# Patient Record
Sex: Female | Born: 2000 | Race: Black or African American | Hispanic: No | Marital: Single | Smoking: Never smoker
Health system: Southern US, Community
[De-identification: ages and names within clinical notes are randomized; demographics above are authoritative.]

## PROBLEM LIST (undated history)

## (undated) ENCOUNTER — Inpatient Hospital Stay (HOSPITAL_COMMUNITY): Payer: Self-pay

---
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
## 2018-08-20 ENCOUNTER — Ambulatory Visit (HOSPITAL_COMMUNITY)
Admission: EM | Admit: 2018-08-20 | Discharge: 2018-08-20 | Disposition: A | Discharge status: Home / Self Care | Payer: BC Managed Care – PPO | Attending: Internal Medicine | Admitting: Internal Medicine

## 2018-08-20 ENCOUNTER — Encounter (HOSPITAL_COMMUNITY): Payer: Self-pay | Admitting: Emergency Medicine

## 2018-08-20 DIAGNOSIS — M5432 Sciatica, left side: Secondary | ICD-10-CM

## 2018-08-20 MED ORDER — METHYLPREDNISOLONE 4 MG PO TBPK: ORAL_TABLET | ORAL | 0 refills | Status: DC

## 2018-08-20 MED ORDER — MELOXICAM 7.5 MG PO TABS: 7.5000 mg | ORAL_TABLET | Freq: Two times a day (BID) | ORAL | 0 refills | Status: DC | PRN

## 2018-08-20 MED ORDER — CYCLOBENZAPRINE HCL 10 MG PO TABS: 10.0000 mg | ORAL_TABLET | Freq: Two times a day (BID) | ORAL | 0 refills | Status: AC | PRN

## 2018-08-20 MED ORDER — TRIAMCINOLONE ACETONIDE 40 MG/ML IJ SUSP
INTRAMUSCULAR | Status: AC
  Filled 2018-08-20: qty 1

## 2018-08-20 NOTE — ED Triage Notes (Signed)
Pt presents to [facility] for yearly recurrence of lower left sided back pain, radiation to hip and back of left leg.  Pt denies loss of control of bowels or bladder.

## 2019-04-28 NOTE — ED Provider Notes (Signed)
[facility]    CSN: [id number] Arrival date & time: 08/20/18  1050      History   Chief Complaint Chief Complaint  Patient presents with  . Back Pain    HPI Maria Sparks is a 18 y.o. female.   Pt with known sciatica c/o back pain that radiates down her left leg.  This is a recurrent problem.  Does not recall injuring her back. Denies weakness in LE.      History reviewed. No pertinent past medical history.  Patient Active Problem List   Diagnosis Date Noted  . Peritonsillar abscess 03/04/2017  . Sore throat 03/04/2017  . Screening-pulmonary TB 02/27/2017  . Acute pharyngitis 02/27/2017  . School physical exam 02/27/2017  . Acne 04/15/2012  . ACUTE SINUSITIS, UNSPECIFIED 05/02/2008  . ACUTE BRONCHITIS 06/29/2007    History reviewed. No pertinent surgical history.  OB History   No obstetric history on file.      Home Medications    Prior to Admission medications   Medication Sig Start Date End Date Taking? Authorizing Provider  cephALEXin (KEFLEX) 500 MG capsule Take 1 capsule (500 mg total) 4 (four) times daily by mouth. 05/31/17   Renne Crigler, PA-C  cyclobenzaprine (FLEXERIL) 10 MG tablet Take 1 tablet (10 mg total) by mouth 2 (two) times daily as needed for muscle spasms. 08/20/18   Arnaldo Natal, MD  ibuprofen (ADVIL,MOTRIN) 600 MG tablet Take 1 tablet (600 mg total) by mouth every 6 (six) hours as needed for mild pain or moderate pain. 02/11/17   Ronnell Freshwater, NP  meloxicam (MOBIC) 7.5 MG tablet Take 1 tablet (7.5 mg total) by mouth 2 (two) times daily as needed for pain. Do NOT take on an empty stomach. Use instead of ibuprofen/Motrin. Drink plenty of water 08/20/18   Arnaldo Natal, MD  methylPREDNISolone (MEDROL DOSEPAK) 4 MG TBPK tablet Take with food 08/20/18   Arnaldo Natal, MD    Family History History reviewed. No pertinent family history.  Social History Social History   Tobacco Use  . Smoking status:  Never Smoker  . Smokeless tobacco: Never Used  Substance Use Topics  . Alcohol use: No  . Drug use: No     Allergies   Patient has no known allergies.   Review of Systems Review of Systems  Constitutional: Negative for chills and fever.  HENT: Negative for sore throat and tinnitus.   Eyes: Negative for redness.  Respiratory: Negative for cough and shortness of breath.   Cardiovascular: Negative for chest pain and palpitations.  Gastrointestinal: Negative for abdominal pain, diarrhea, nausea and vomiting.  Genitourinary: Negative for dysuria, frequency and urgency.  Musculoskeletal: Positive for back pain. Negative for myalgias.  Skin: Negative for rash.       No lesions  Neurological: Negative for weakness.  Hematological: Does not bruise/bleed easily.  Psychiatric/Behavioral: Negative for suicidal ideas.     Physical Exam Triage Vital Signs ED Triage Vitals [08/20/18 1202]  Enc Vitals Group     BP 119/75     Pulse Rate 77     Resp 16     Temp 99 F (37.2 C)     Temp Source Temporal     SpO2 98 %     Weight      Height      Head Circumference      Peak Flow      Pain Score 10     Pain Loc  Pain Edu?      Excl. in [place]?    No data found.  Updated Vital Signs BP 119/75 (BP Location: Left Arm)   Pulse 77   Temp 99 F (37.2 C) (Temporal)   Resp 16   LMP 08/20/2018   SpO2 98%   Visual Acuity Right Eye Distance:   Left Eye Distance:   Bilateral Distance:    Right Eye Near:   Left Eye Near:    Bilateral Near:     Physical Exam Vitals signs and nursing note reviewed.  Constitutional:      General: She is not in acute distress.    Appearance: She is well-developed.  HENT:     Head: Normocephalic and atraumatic.  Eyes:     General: No scleral icterus.    Conjunctiva/sclera: Conjunctivae normal.     Pupils: Pupils are equal, round, and reactive to light.  Neck:     Musculoskeletal: Normal range of motion and neck supple.     Thyroid: No  thyromegaly.     Vascular: No JVD.     Trachea: No tracheal deviation.  Cardiovascular:     Rate and Rhythm: Normal rate and regular rhythm.     Heart sounds: Normal heart sounds. No murmur. No friction rub. No gallop.   Pulmonary:     Effort: Pulmonary effort is normal.     Breath sounds: Normal breath sounds.  Abdominal:     General: Bowel sounds are normal. There is no distension.     Palpations: Abdomen is soft.     Tenderness: There is no abdominal tenderness.  Musculoskeletal: Normal range of motion.  Lymphadenopathy:     Cervical: No cervical adenopathy.  Skin:    General: Skin is warm and dry.  Neurological:     Mental Status: She is alert and oriented to person, place, and time.     Cranial Nerves: No cranial nerve deficit.  Psychiatric:        Behavior: Behavior normal.        Thought Content: Thought content normal.        Judgment: Judgment normal.      UC Treatments / Results  Labs (all labs ordered are listed, but only abnormal results are displayed) Labs Reviewed - No data to display  EKG   Radiology No results found.  Procedures Procedures (including critical care time)  Medications Ordered in UC Medications  triamcinolone acetonide (KENALOG-40) 40 MG/ML injection (has no administration in time range)    Initial Impression / Assessment and Plan / UC Course  I have reviewed the triage vital signs and the nursing notes.  Pertinent labs & imaging results that were available during my care of the patient were reviewed by me and considered in my medical decision making (see chart for details).     No red flag symptoms Final Clinical Impressions(s) / UC Diagnoses   Final diagnoses:  Sciatica of left side   Discharge Instructions   None    ED Prescriptions    Medication Sig Dispense Auth. Provider   methylPREDNISolone (MEDROL DOSEPAK) 4 MG TBPK tablet Take with food 21 tablet Harrie Foreman, MD   cyclobenzaprine (FLEXERIL) 10 MG tablet  Take 1 tablet (10 mg total) by mouth 2 (two) times daily as needed for muscle spasms. 20 tablet Harrie Foreman, MD   meloxicam (MOBIC) 7.5 MG tablet Take 1 tablet (7.5 mg total) by mouth 2 (two) times daily as needed for pain. Do NOT take on  an empty stomach. Use instead of ibuprofen/Motrin. Drink plenty of water 14 tablet Arnaldo Nataliamond, Goddess Gebbia S, MD     PDMP not reviewed this encounter.   Arnaldo Nataliamond, Aayliah Rotenberry S, MD 04/28/19 [phone]
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
## 2019-06-29 ENCOUNTER — Other Ambulatory Visit: Payer: Self-pay

## 2019-06-29 DIAGNOSIS — Z20822 Contact with and (suspected) exposure to covid-19: Secondary | ICD-10-CM

## 2019-07-01 LAB — NOVEL CORONAVIRUS, NAA: SARS-CoV-2, NAA: NOT DETECTED

## 2021-12-25 ENCOUNTER — Other Ambulatory Visit: Payer: Self-pay

## 2021-12-25 ENCOUNTER — Encounter (HOSPITAL_COMMUNITY): Payer: Self-pay

## 2021-12-25 ENCOUNTER — Emergency Department (HOSPITAL_COMMUNITY)
Admission: EM | Admit: 2021-12-25 | Discharge: 2021-12-25 | Disposition: A | Discharge status: Home / Self Care | Payer: BC Managed Care – PPO | Attending: Emergency Medicine | Admitting: Emergency Medicine

## 2021-12-25 DIAGNOSIS — N921 Excessive and frequent menstruation with irregular cycle: Secondary | ICD-10-CM | POA: Diagnosis not present

## 2021-12-25 DIAGNOSIS — N939 Abnormal uterine and vaginal bleeding, unspecified: Secondary | ICD-10-CM | POA: Insufficient documentation

## 2021-12-25 DIAGNOSIS — M549 Dorsalgia, unspecified: Secondary | ICD-10-CM | POA: Diagnosis not present

## 2021-12-25 LAB — URINALYSIS, ROUTINE W REFLEX MICROSCOPIC
Bilirubin Urine: NEGATIVE
Glucose, UA: NEGATIVE mg/dL
Ketones, ur: NEGATIVE mg/dL
Nitrite: NEGATIVE
Protein, ur: NEGATIVE mg/dL
Specific Gravity, Urine: 1.025 (ref 1.005–1.030)
pH: 6 (ref 5.0–8.0)

## 2021-12-25 LAB — CBC WITH DIFFERENTIAL/PLATELET
Abs Immature Granulocytes: 0.01 10*3/uL (ref 0.00–0.07)
Basophils Absolute: 0.1 10*3/uL (ref 0.0–0.1)
Basophils Relative: 1 %
Eosinophils Absolute: 0.3 10*3/uL (ref 0.0–0.5)
Eosinophils Relative: 4 %
HCT: 48.4 % — ABNORMAL HIGH (ref 36.0–46.0)
Hemoglobin: 15.2 g/dL — ABNORMAL HIGH (ref 12.0–15.0)
Immature Granulocytes: 0 %
Lymphocytes Relative: 29 %
Lymphs Abs: 2.3 10*3/uL (ref 0.7–4.0)
MCH: 29.4 pg (ref 26.0–34.0)
MCHC: 31.4 g/dL (ref 30.0–36.0)
MCV: 93.6 fL (ref 80.0–100.0)
Monocytes Absolute: 0.6 10*3/uL (ref 0.1–1.0)
Monocytes Relative: 7 %
Neutro Abs: 4.9 10*3/uL (ref 1.7–7.7)
Neutrophils Relative %: 59 %
Platelets: 376 10*3/uL (ref 150–400)
RBC: 5.17 MIL/uL — ABNORMAL HIGH (ref 3.87–5.11)
RDW: 13 % (ref 11.5–15.5)
WBC: 8.1 10*3/uL (ref 4.0–10.5)
nRBC: 0 % (ref 0.0–0.2)

## 2021-12-25 LAB — TYPE AND SCREEN
ABO/RH(D): A POS
Antibody Screen: NEGATIVE

## 2021-12-25 LAB — ABO/RH: ABO/RH(D): A POS

## 2021-12-25 LAB — PREGNANCY, URINE: Preg Test, Ur: NEGATIVE

## 2021-12-25 MED ORDER — IBUPROFEN 400 MG PO TABS
600.0000 mg | ORAL_TABLET | Freq: Once | ORAL | Status: AC
  Administered 2021-12-25: 600 mg via ORAL
  Filled 2021-12-25: qty 1

## 2021-12-25 MED ORDER — CEPHALEXIN 500 MG PO CAPS: 500.0000 mg | ORAL_CAPSULE | Freq: Three times a day (TID) | ORAL | 0 refills | Status: AC

## 2021-12-25 NOTE — ED Triage Notes (Signed)
Pt. Stated, ive been bleeding off and on since May 29 start and stop with some clots. Im having back pain

## 2021-12-25 NOTE — ED Provider Triage Note (Signed)
Emergency Medicine Provider Triage Evaluation Note  Maria Sparks , a 21 y.o. female  was evaluated in triage.  Pt complains of vaginal bleeding since 5/29.  Says that that was the first day of her.  And that it was heavy and then the blood turned brown and then it turned red again and then problem again.  Usually does not have periods that last 8 days.  Has not seen an OB/GYN.  Says that she has passed some blood clots.  No dysuria.  Complains of back pain.  Review of Systems  Positive: Back pain, heavy. Negative: Urinary symptoms  Physical Exam  BP 108/71 (BP Location: Right Arm)   Pulse 70   Temp 98.4 F (36.9 C) (Oral)   Resp 15   SpO2 100%  Gen:   Awake, no distress   Resp:  Normal effort  MSK:   Moves extremities without difficulty  Other:  Negative CVA, no abdominal tenderness, tenderness to the left-sided paraspinal muscles of the lumbar spine  Medical Decision Making  Medically screening exam initiated at 10:12 AM.  Appropriate orders placed.  CHEE SULEK was informed that the remainder of the evaluation will be completed by another provider, this initial triage assessment does not replace that evaluation, and the importance of remaining in the ED until their evaluation is complete.   "What really made me worried is that it was heavier than usual and the cramps lasted longer than usual."  Likely needs to see OB/GYN about this complaint   Laqueta Bonaventura A, PA-C 12/25/21 1013

## 2021-12-25 NOTE — ED Provider Notes (Signed)
[facility]   CSN: [id number] Arrival date & time: 12/25/21  0944     History  Chief Complaint  Patient presents with   Vaginal Bleeding   Back Pain    Maria BorgLeana S Sparks is a 21 y.o. female.   Vaginal Bleeding Associated symptoms: back pain   Back Pain  Patient is a 21 year old female with past medical history significant for  She presents emergency room today with complaints of vaginal bleeding for approximately 8 days.  She states that she was having some abdominal cramping which is now resolved.  She states that the vaginal bleeding was initially quite heavy and then turned brown and then slowed down.  She states that this is her first time having such a lengthy period.  Currently she denies any pain no nausea no vomiting she has been eating and drinking normally no changes of her bowel movements  She states that she feels relatively well at this moment.  She states that she was primarily concerned because she has not experienced bleeding for this long before.     Home Medications Prior to Admission medications   Medication Sig Start Date End Date Taking? Authorizing Provider  cephALEXin (KEFLEX) 500 MG capsule Take 1 capsule (500 mg total) by mouth 3 (three) times daily for 5 days. 12/25/21 12/30/21  Gailen ShelterFondaw, Daisey Caloca S, PA  cyclobenzaprine (FLEXERIL) 10 MG tablet Take 1 tablet (10 mg total) by mouth 2 (two) times daily as needed for muscle spasms. 08/20/18   Arnaldo Nataliamond, Michael S, MD  ibuprofen (ADVIL,MOTRIN) 600 MG tablet Take 1 tablet (600 mg total) by mouth every 6 (six) hours as needed for mild pain or moderate pain. 02/11/17   Ronnell FreshwaterPatterson, Mallory Honeycutt, NP  meloxicam (MOBIC) 7.5 MG tablet Take 1 tablet (7.5 mg total) by mouth 2 (two) times daily as needed for pain. Do NOT take on an empty stomach. Use instead of ibuprofen/Motrin. Drink plenty of water 08/20/18   Arnaldo Nataliamond, Michael S, MD  methylPREDNISolone (MEDROL DOSEPAK) 4 MG  TBPK tablet Take with food 08/20/18   Arnaldo Nataliamond, Michael S, MD      Allergies    Patient has no known allergies.    Review of Systems   Review of Systems  Genitourinary:  Positive for vaginal bleeding.  Musculoskeletal:  Positive for back pain.   Physical Exam Updated Vital Signs BP 112/85   Pulse 67   Temp 98 F (36.7 C)   Resp 20   Ht 5\' 8"  (1.727 m)   Wt 70.8 kg   LMP 12/16/2021   SpO2 100%   BMI 23.72 kg/m  Physical Exam Vitals and nursing note reviewed.  Constitutional:      General: She is not in acute distress.    Comments: Pleasant well-appearing 21 year old.  In no acute distress.  Sitting comfortably in bed.  Able answer questions appropriately follow commands. No increased work of breathing. Speaking in full sentences.   HENT:     Head: Normocephalic and atraumatic.     Nose: Nose normal.  Eyes:     General: No scleral icterus. Cardiovascular:     Rate and Rhythm: Normal rate and regular rhythm.     Pulses: Normal pulses.     Heart sounds: Normal heart sounds.  Pulmonary:     Effort: Pulmonary effort is normal. No respiratory distress.     Breath sounds: No wheezing.  Abdominal:     Palpations: Abdomen is soft.     Tenderness:  There is no abdominal tenderness. There is no guarding or rebound.     Comments: Abdomen soft, no tenderness to palpation.  Musculoskeletal:     Cervical back: Normal range of motion.     Right lower leg: No edema.     Left lower leg: No edema.  Skin:    General: Skin is warm and dry.     Capillary Refill: Capillary refill takes less than 2 seconds.  Neurological:     Mental Status: She is alert. Mental status is at baseline.  Psychiatric:        Mood and Affect: Mood normal.        Behavior: Behavior normal.    ED Results / Procedures / Treatments   Labs (all labs ordered are listed, but only abnormal results are displayed) Labs Reviewed  CBC WITH DIFFERENTIAL/PLATELET - Abnormal; Notable for the following components:       Result Value   RBC 5.17 (*)    Hemoglobin 15.2 (*)    HCT 48.4 (*)    All other components within normal limits  URINALYSIS, ROUTINE W REFLEX MICROSCOPIC - Abnormal; Notable for the following components:   APPearance HAZY (*)    Hgb urine dipstick LARGE (*)    Leukocytes,Ua TRACE (*)    Bacteria, UA MANY (*)    All other components within normal limits  URINE CULTURE  PREGNANCY, URINE  TYPE AND SCREEN  ABO/RH    EKG None  Radiology No results found.  Procedures Procedures    Medications Ordered in ED Medications  ibuprofen (ADVIL) tablet 600 mg (600 mg Oral Given 12/25/21 1412)    ED Course/ Medical Decision Making/ A&P                           Medical Decision Making Amount and/or Complexity of Data Reviewed Labs: ordered.  Risk Prescription drug management.   This patient presents to the ED for concern of vaginal bleeding, this involves a number of treatment options, and is a complaint that carries with it a moderate risk of complications and morbidity.  The differential diagnosis includes Differential diagnosis includes, but is not limited to, threatened miscarriage, incomplete miscarriage, normal bleeding from an early trimester pregnancy, ectopic pregnancy, , blighted ovum, vaginal/cervical trauma, subchorionic hemorrhage/hematoma, etc.    Co morbidities: Discussed in HPI   Brief History:  Patient is a 21 year old female with past medical history significant for  She presents emergency room today with complaints of vaginal bleeding for approximately 8 days.  She states that she was having some abdominal cramping which is now resolved.  She states that the vaginal bleeding was initially quite heavy and then turned brown and then slowed down.  She states that this is her first time having such a lengthy period.  Currently she denies any pain no nausea no vomiting she has been eating and drinking normally no changes of her bowel movements  She states that  she feels relatively well at this moment.  She states that she was primarily concerned because she has not experienced bleeding for this long before.   Physical exam unremarkable.  No abdominal tenderness.  Patient is well-appearing on exam nontoxic.   EMR reviewed including pt PMHx, past surgical history and past visits to ER.   See HPI for more details   Lab Tests:   I ordered and independently interpreted labs. Labs notable for hemoglobin which is normal/slightly elevated.  No leukocytosis.  Urinalysis many bacteria trace leukocytes will treat for UTI given some back pain and her other symptoms.  Urine culture obtained and pending.  Negative pregnancy test.   Imaging Studies:  No imaging studies ordered for this patient    Cardiac Monitoring:  NA NA   Medicines ordered:  I ordered medication including ibuprofen for bleeding/pain Reevaluation of the patient after these medicines showed that the patient improved I have reviewed the patients home medicines and have made adjustments as needed   Critical Interventions:     Consults/Attending Physician      Reevaluation:  After the interventions noted above I re-evaluated patient and found that they have :improved   Social Determinants of Health:      Problem List / ED Course:  Menometrorrhagia.  Recommend follow-up with OB/GYN.  Patient is neither anemic nor symptomatic of her blood loss.  Recommend ibuprofen and provided patient with return precautions.   Dispostion:  After consideration of the diagnostic results and the patients response to treatment, I feel that the patent would benefit from close outpatient follow-up with OB/GYN    Final Clinical Impression(s) / ED Diagnoses Final diagnoses:  Vaginal bleeding  Menometrorrhagia    Rx / DC Orders ED Discharge Orders          Ordered    cephALEXin (KEFLEX) 500 MG capsule  3 times daily        12/25/21 1422              Gailen Shelter, [place] 12/26/21 1146    Melene Plan, DO 12/26/21 1431

## 2021-12-25 NOTE — Discharge Instructions (Addendum)
Please take ibuprofen 600 mg every 6 hours.  Drink plenty of water and take Keflex as prescribed.  Your urine culture is running and will result in a couple days.  Please follow-up with an OB/GYN.  Return to the emergency room for any new or concerning symptoms.

## 2021-12-27 LAB — URINE CULTURE: Culture: NO GROWTH

## 2022-04-29 ENCOUNTER — Ambulatory Visit
Admission: EM | Admit: 2022-04-29 | Discharge: 2022-04-29 | Disposition: A | Discharge status: Home / Self Care | Payer: BC Managed Care – PPO | Attending: Physician Assistant | Admitting: Physician Assistant

## 2022-04-29 ENCOUNTER — Encounter: Payer: Self-pay | Admitting: Physician Assistant

## 2022-04-29 DIAGNOSIS — L0201 Cutaneous abscess of face: Secondary | ICD-10-CM | POA: Diagnosis not present

## 2022-04-29 MED ORDER — DOXYCYCLINE HYCLATE 100 MG PO CAPS: 100.0000 mg | ORAL_CAPSULE | Freq: Two times a day (BID) | ORAL | 0 refills | Status: DC

## 2022-04-29 NOTE — ED Triage Notes (Signed)
Pt presents to uc with swelling in chin area with pain. Pt reports has gotten larger over the last two days.

## 2022-04-29 NOTE — ED Provider Notes (Signed)
[facility]    CSN: [id number] Arrival date & time: 04/29/22  1751      History   Chief Complaint Chief Complaint  Patient presents with   Facial Swelling    HPI Maria Sparks is a 21 y.o. female.   Patient here today for evaluation of swelling to her right chin area that has been ongoing for the last several days however has worsened within the last 2 days.  She reports initially she felt as if area was somewhat mobile however over the last few days has become larger and more painful.  She denies any fever.  She has not had any nausea or vomiting.  She does not report treatment for symptoms. She denies injury.   The history is provided by the patient.    History reviewed. No pertinent past medical history.  Patient Active Problem List   Diagnosis Date Noted   Peritonsillar abscess 03/04/2017   Sore throat 03/04/2017   Screening-pulmonary TB 02/27/2017   Acute pharyngitis 02/27/2017   School physical exam 02/27/2017   Acne 04/15/2012   ACUTE SINUSITIS, UNSPECIFIED 05/02/2008   ACUTE BRONCHITIS 06/29/2007    History reviewed. No pertinent surgical history.  OB History   No obstetric history on file.      Home Medications    Prior to Admission medications   Medication Sig Start Date End Date Taking? Authorizing Provider  doxycycline (VIBRAMYCIN) 100 MG capsule Take 1 capsule (100 mg total) by mouth 2 (two) times daily. 04/29/22  Yes Francene Finders, PA-C  cyclobenzaprine (FLEXERIL) 10 MG tablet Take 1 tablet (10 mg total) by mouth 2 (two) times daily as needed for muscle spasms. 08/20/18   Harrie Foreman, MD  ibuprofen (ADVIL,MOTRIN) 600 MG tablet Take 1 tablet (600 mg total) by mouth every 6 (six) hours as needed for mild pain or moderate pain. 02/11/17   Benjamine Sprague, NP  meloxicam (MOBIC) 7.5 MG tablet Take 1 tablet (7.5 mg total) by mouth 2 (two) times daily as needed for pain. Do NOT take on an empty stomach. Use instead of  ibuprofen/Motrin. Drink plenty of water 08/20/18   Harrie Foreman, MD  methylPREDNISolone (MEDROL DOSEPAK) 4 MG TBPK tablet Take with food 08/20/18   Harrie Foreman, MD    Family History History reviewed. No pertinent family history.  Social History Social History   Tobacco Use   Smoking status: Never   Smokeless tobacco: Never  Substance Use Topics   Alcohol use: No   Drug use: No     Allergies   Patient has no known allergies.   Review of Systems Review of Systems  Constitutional:  Negative for chills and fever.  HENT:  Positive for facial swelling.   Eyes:  Negative for discharge and redness.  Respiratory:  Negative for shortness of breath.   Gastrointestinal:  Negative for abdominal pain, nausea and vomiting.  Skin:  Negative for color change and wound.     Physical Exam Triage Vital Signs ED Triage Vitals  Enc Vitals Group     BP      Pulse      Resp      Temp      Temp src      SpO2      Weight      Height      Head Circumference      Peak Flow      Pain Score      Pain Loc  Pain Edu?      Excl. in [place]?    No data found.  Updated Vital Signs BP 104/65   Pulse 70   Temp 97.9 F (36.6 C)   Resp 18   SpO2 98%      Physical Exam Vitals and nursing note reviewed.  Constitutional:      General: She is not in acute distress.    Appearance: Normal appearance. She is not ill-appearing.  HENT:     Head: Normocephalic and atraumatic.     Comments: Mild swelling appreciated to right chin, no erythema, no wound, no drainage or bleeding Eyes:     Conjunctiva/sclera: Conjunctivae normal.  Cardiovascular:     Rate and Rhythm: Normal rate.  Pulmonary:     Effort: Pulmonary effort is normal.  Neurological:     Mental Status: She is alert.  Psychiatric:        Mood and Affect: Mood normal.        Behavior: Behavior normal.        Thought Content: Thought content normal.      UC Treatments / Results  Labs (all labs ordered are  listed, but only abnormal results are displayed) Labs Reviewed - No data to display  EKG   Radiology No results found.  Procedures Procedures (including critical care time)  Medications Ordered in UC Medications - No data to display  Initial Impression / Assessment and Plan / UC Course  I have reviewed the triage vital signs and the nursing notes.  Pertinent labs & imaging results that were available during my care of the patient were reviewed by me and considered in my medical decision making (see chart for details).    Suspect abscess. Given location will defer incision and drainage- will treat with doxycycline and encouraged follow up if no improvement or with any further concerns.   Final Clinical Impressions(s) / UC Diagnoses   Final diagnoses:  Abscess of chin   Discharge Instructions   None    ED Prescriptions     Medication Sig Dispense Auth. Provider   doxycycline (VIBRAMYCIN) 100 MG capsule Take 1 capsule (100 mg total) by mouth 2 (two) times daily. 20 capsule Francene Finders, PA-C      PDMP not reviewed this encounter.   Francene Finders, PA-C 04/29/22 [phone]

## 2022-12-04 ENCOUNTER — Ambulatory Visit: Payer: Self-pay

## 2022-12-04 ENCOUNTER — Other Ambulatory Visit: Payer: Self-pay | Admitting: Family Medicine

## 2022-12-04 DIAGNOSIS — M545 Low back pain, unspecified: Secondary | ICD-10-CM

## 2023-08-25 ENCOUNTER — Encounter (HOSPITAL_COMMUNITY): Payer: Self-pay | Admitting: Obstetrics and Gynecology

## 2023-08-25 ENCOUNTER — Inpatient Hospital Stay (HOSPITAL_COMMUNITY)
Admission: AD | Admit: 2023-08-25 | Discharge: 2023-08-25 | Disposition: A | Discharge status: Home / Self Care | Payer: 59 | Attending: Obstetrics and Gynecology | Admitting: Obstetrics and Gynecology

## 2023-08-25 DIAGNOSIS — O26892 Other specified pregnancy related conditions, second trimester: Secondary | ICD-10-CM | POA: Diagnosis not present

## 2023-08-25 DIAGNOSIS — Z3A22 22 weeks gestation of pregnancy: Secondary | ICD-10-CM | POA: Diagnosis not present

## 2023-08-25 DIAGNOSIS — O4692 Antepartum hemorrhage, unspecified, second trimester: Secondary | ICD-10-CM | POA: Insufficient documentation

## 2023-08-25 DIAGNOSIS — O4690 Antepartum hemorrhage, unspecified, unspecified trimester: Secondary | ICD-10-CM

## 2023-08-25 LAB — WET PREP, GENITAL
Clue Cells Wet Prep HPF POC: NONE SEEN
Sperm: NONE SEEN
Trich, Wet Prep: NONE SEEN
WBC, Wet Prep HPF POC: <10 (ref <10)
Yeast Wet Prep HPF POC: NONE SEEN

## 2023-08-25 NOTE — Discharge Instructions (Signed)

## 2023-08-25 NOTE — MAU Note (Addendum)
.  Maria Sparks is a 23 y.o. at [redacted]w[redacted]d here in MAU reporting spotting Sunday after urinating. Spotted twice yesterday both times after a BM. Bright blood. Did have intercourse Sat. Reports good FM and denies any pain. Gets her care at [facility] in [facility]. No placental issues of which pt is aware. No bleeding today  Onset of complaint: Sat Pain score: 0 Vitals:   08/25/23 1956 08/25/23 2000  BP:  (!) 107/58  Pulse: 75   Resp: 17   Temp: 98.1 F (36.7 C)   SpO2: 100%      FHT: 138  Lab orders placed from triage: none

## 2023-08-25 NOTE — Progress Notes (Signed)
 Written and verbal d/c instructions given and understanding voiced.

## 2023-08-25 NOTE — MAU Provider Note (Signed)
 Chief Complaint:  Vaginal Bleeding   Event Date/Time   First Provider Initiated Contact with Patient 08/25/23 2157     HPI: Maria Sparks is a 23 y.o. G2P1000 at 30w1dwho presents to maternity admissions reporting spotting since Sunday.  It happened once after urination and twice after BMs as well as after intercourse. No bleeding currently   Has no pain. . She denies LOF, vaginal itching/burning, urinary symptoms, diarrhea, constipation or fever/chills.   Vaginal Bleeding The patient's primary symptoms include vaginal bleeding. The patient's pertinent negatives include no genital itching, genital odor or pelvic pain. The current episode started in the past 7 days. The problem has been resolved. She is pregnant. Pertinent negatives include no abdominal pain, back pain, chills, constipation, diarrhea, dysuria or fever. The vaginal discharge was bloody. There has been no bleeding (none now, had spotting befofe). She has not been passing clots. She has not been passing tissue. Nothing aggravates the symptoms. She has tried nothing for the symptoms.   RN note: Maria Sparks is a 22 y.o. at [redacted]w[redacted]d here in MAU reporting spotting Sunday after urinating. Spotted twice yesterday both times after a BM. Bright blood. Did have intercourse Sat. Reports good FM and denies any pain. Gets her care at [facility] in [facility]. No placental issues of which pt is aware. No bleeding today Onset of complaint: Sat Pain score: 0  Past Medical History: No past medical history on file.  Past obstetric history: OB History  Gravida Para Term Preterm AB Living  2 1 1      SAB IAB Ectopic Multiple Live Births          # Outcome Date GA Lbr Len/2nd Weight Sex Type Anes PTL Lv  2 Current           1 Term 03/05/20     Vag-Spont       Past Surgical History: No past surgical history on file.  Family History: No family history on file.  Social History: Social History   Tobacco Use   Smoking status: Never   Smokeless  tobacco: Never  Substance Use Topics   Alcohol use: No   Drug use: No    Allergies: No Known Allergies  Meds:  Medications Prior to Admission  Medication Sig Dispense Refill Last Dose/Taking   cyclobenzaprine  (FLEXERIL ) 10 MG tablet Take 1 tablet (10 mg total) by mouth 2 (two) times daily as needed for muscle spasms. 20 tablet 0    doxycycline  (VIBRAMYCIN ) 100 MG capsule Take 1 capsule (100 mg total) by mouth 2 (two) times daily. 20 capsule 0    ibuprofen  (ADVIL ,MOTRIN ) 600 MG tablet Take 1 tablet (600 mg total) by mouth every 6 (six) hours as needed for mild pain or moderate pain. 30 tablet 0    meloxicam  (MOBIC ) 7.5 MG tablet Take 1 tablet (7.5 mg total) by mouth 2 (two) times daily as needed for pain. Do NOT take on an empty stomach. Use instead of ibuprofen /Motrin . Drink plenty of water 14 tablet 0    methylPREDNISolone  (MEDROL  DOSEPAK) 4 MG TBPK tablet Take with food 21 tablet 0     I have reviewed patient's Past Medical Hx, Surgical Hx, Family Hx, Social Hx, medications and allergies.   ROS:  Review of Systems  Constitutional:  Negative for chills and fever.  Gastrointestinal:  Negative for abdominal pain, constipation and diarrhea.  Genitourinary:  Positive for vaginal bleeding. Negative for dysuria and pelvic pain.  Musculoskeletal:  Negative for back pain.  Other systems negative  Physical Exam  Patient Vitals for the past 24 hrs:  BP Temp Pulse Resp SpO2 Height Weight  08/25/23 2000 (!) 107/58 -- -- -- -- -- --  08/25/23 1956 -- 98.1 F (36.7 C) 75 17 100 % 5' 7 (1.702 m) 87.5 kg   Constitutional: Well-developed, well-nourished female in no acute distress.  Cardiovascular: normal rate  Respiratory: normal effort GI: Abd soft, non-tender, gravid appropriate for gestational age.   No rebound or guarding. MS: Extremities nontender, no edema, normal ROM Neurologic: Alert and oriented x 4.  GU: Neg CVAT.  PELVIC EXAM: Cervix pink, visually closed, without lesion,  scant white creamy discharge, vaginal walls and external genitalia normal  FHT:   138 Labs: Results for orders placed or performed during the hospital encounter of 08/25/23 (from the past 24 hours)  Wet prep, genital     Status: None   Collection Time: 08/25/23  9:33 PM   Specimen: Vaginal  Result Value Ref Range   Yeast Wet Prep HPF POC NONE SEEN NONE SEEN   Trich, Wet Prep NONE SEEN NONE SEEN   Clue Cells Wet Prep HPF POC NONE SEEN NONE SEEN   WBC, Wet Prep HPF POC <10 <10   Sperm NONE SEEN        Imaging:  No results found.  MAU Course/MDM: I have reviewed the triage vital signs and the nursing notes.   Pertinent labs & imaging results that were available during my care of the patient were reviewed by me and considered in my medical decision making (see chart for details).      I have reviewed her medical records including past results, notes and treatments.   I have ordered labs and reviewed results.    Treatments in MAU included speculum exam Discussed no blood is seen  May have come from rectum given occurrence after BMs.  Recommend pelvic rest.    Assessment: Vaginal bleeding during pregnancy - Plan: Discharge patient  [redacted] weeks gestation of pregnancy - Plan: Discharge patient   Plan: Discharge home Pelvic rest Bleeding precautions Follow up in Office in [facility] for prenatal visits and recheck Encouraged to return if she develops worsening of symptoms, increase in pain, fever, or other concerning symptoms.   Pt stable at time of discharge.  Earnie Pouch CNM, MSN Certified Nurse-Midwife 08/25/2023 9:57 PM
